# Patient Record
Sex: Female | Born: 1960 | Race: White | Hispanic: No | Marital: Single | Smoking: Never smoker
Health system: Southern US, Community
[De-identification: ages and names within clinical notes are randomized; demographics above are authoritative.]

## PROBLEM LIST (undated history)

## (undated) DIAGNOSIS — K219 Gastro-esophageal reflux disease without esophagitis: Secondary | ICD-10-CM

## (undated) DIAGNOSIS — I10 Essential (primary) hypertension: Secondary | ICD-10-CM

## (undated) DIAGNOSIS — K2970 Gastritis, unspecified, without bleeding: Secondary | ICD-10-CM

## (undated) DIAGNOSIS — D649 Anemia, unspecified: Secondary | ICD-10-CM

## (undated) HISTORY — PX: UPPER GASTROINTESTINAL ENDOSCOPY: SHX188

## (undated) HISTORY — PX: TONSILLECTOMY: SUR1361

## (undated) HISTORY — PX: COLONOSCOPY: SHX174

---

## 2005-01-29 ENCOUNTER — Ambulatory Visit: Payer: Self-pay | Admitting: Internal Medicine

## 2006-03-01 ENCOUNTER — Ambulatory Visit: Payer: Self-pay | Admitting: Internal Medicine

## 2007-03-28 ENCOUNTER — Ambulatory Visit: Payer: Self-pay | Admitting: Internal Medicine

## 2008-06-24 ENCOUNTER — Ambulatory Visit: Payer: Self-pay | Admitting: Obstetrics and Gynecology

## 2009-06-23 ENCOUNTER — Ambulatory Visit: Payer: Self-pay | Admitting: Gastroenterology

## 2009-06-25 ENCOUNTER — Ambulatory Visit: Payer: Self-pay | Admitting: Obstetrics and Gynecology

## 2010-07-07 ENCOUNTER — Ambulatory Visit: Payer: Self-pay | Admitting: Obstetrics and Gynecology

## 2011-08-03 ENCOUNTER — Ambulatory Visit: Payer: Self-pay | Admitting: Obstetrics and Gynecology

## 2012-10-05 ENCOUNTER — Ambulatory Visit: Payer: Self-pay | Admitting: Obstetrics and Gynecology

## 2013-06-14 ENCOUNTER — Ambulatory Visit: Payer: Self-pay | Admitting: Internal Medicine

## 2013-11-29 ENCOUNTER — Ambulatory Visit: Payer: Self-pay | Admitting: Obstetrics and Gynecology

## 2014-12-02 ENCOUNTER — Ambulatory Visit: Payer: Self-pay | Admitting: Internal Medicine

## 2015-06-25 ENCOUNTER — Other Ambulatory Visit: Payer: Self-pay | Admitting: Internal Medicine

## 2015-06-25 DIAGNOSIS — Z1231 Encounter for screening mammogram for malignant neoplasm of breast: Secondary | ICD-10-CM

## 2015-12-03 ENCOUNTER — Ambulatory Visit
Admission: RE | Admit: 2015-12-03 | Discharge: 2015-12-03 | Disposition: A | Discharge status: Home / Self Care | Payer: BLUE CROSS/BLUE SHIELD | Source: Ambulatory Visit | Attending: Internal Medicine | Admitting: Internal Medicine

## 2015-12-03 DIAGNOSIS — Z1231 Encounter for screening mammogram for malignant neoplasm of breast: Secondary | ICD-10-CM | POA: Insufficient documentation

## 2016-09-27 ENCOUNTER — Other Ambulatory Visit: Payer: Self-pay | Admitting: Obstetrics & Gynecology

## 2016-09-27 DIAGNOSIS — Z1231 Encounter for screening mammogram for malignant neoplasm of breast: Secondary | ICD-10-CM

## 2016-11-23 ENCOUNTER — Encounter
Admission: RE | Admit: 2016-11-23 | Discharge: 2016-11-23 | Disposition: A | Discharge status: Home / Self Care | Payer: BLUE CROSS/BLUE SHIELD | Source: Ambulatory Visit | Attending: Obstetrics & Gynecology | Admitting: Obstetrics & Gynecology

## 2016-11-23 DIAGNOSIS — N840 Polyp of corpus uteri: Secondary | ICD-10-CM | POA: Insufficient documentation

## 2016-11-23 DIAGNOSIS — Z01812 Encounter for preprocedural laboratory examination: Secondary | ICD-10-CM | POA: Insufficient documentation

## 2016-11-23 DIAGNOSIS — R938 Abnormal findings on diagnostic imaging of other specified body structures: Secondary | ICD-10-CM | POA: Insufficient documentation

## 2016-11-23 DIAGNOSIS — Z0183 Encounter for blood typing: Secondary | ICD-10-CM | POA: Diagnosis not present

## 2016-11-23 DIAGNOSIS — Z01818 Encounter for other preprocedural examination: Secondary | ICD-10-CM | POA: Insufficient documentation

## 2016-11-23 DIAGNOSIS — R9431 Abnormal electrocardiogram [ECG] [EKG]: Secondary | ICD-10-CM | POA: Diagnosis not present

## 2016-11-23 HISTORY — DX: Essential (primary) hypertension: I10

## 2016-11-23 HISTORY — DX: Gastritis, unspecified, without bleeding: K29.70

## 2016-11-23 HISTORY — DX: Gastro-esophageal reflux disease without esophagitis: K21.9

## 2016-11-23 HISTORY — DX: Anemia, unspecified: D64.9

## 2016-11-23 LAB — CBC
HCT: 39.1 % (ref 35.0–47.0)
Hemoglobin: 13.3 g/dL (ref 12.0–16.0)
MCH: 28.8 pg (ref 26.0–34.0)
MCHC: 34 g/dL (ref 32.0–36.0)
MCV: 84.5 fL (ref 80.0–100.0)
PLATELETS: 456 10*3/uL — AB (ref 150–440)
RBC: 4.62 MIL/uL (ref 3.80–5.20)
RDW: 14.8 % — ABNORMAL HIGH (ref 11.5–14.5)
WBC: 8 10*3/uL (ref 3.6–11.0)

## 2016-11-23 LAB — BASIC METABOLIC PANEL
Anion gap: 8 (ref 5–15)
BUN: 15 mg/dL (ref 6–20)
CO2: 28 mmol/L (ref 22–32)
Calcium: 10 mg/dL (ref 8.9–10.3)
Chloride: 102 mmol/L (ref 101–111)
Creatinine, Ser: 0.75 mg/dL (ref 0.44–1.00)
GFR calc Af Amer: >60 mL/min (ref >60)
Glucose, Bld: 103 mg/dL — ABNORMAL HIGH (ref 65–99)
POTASSIUM: 3.8 mmol/L (ref 3.5–5.1)
SODIUM: 138 mmol/L (ref 135–145)

## 2016-11-23 LAB — TYPE AND SCREEN
ABO/RH(D): A POS
Antibody Screen: NEGATIVE

## 2016-11-23 NOTE — Patient Instructions (Signed)
Your procedure is scheduled on: Friday 12/03/16 Report to DAY SURGERY. 2ND FLOOR MEDICAL MALL ENTRANCE. To find out your arrival time please call [phone] between 1PM - 3PM on Thursday 12/02/16.  Remember: Instructions that are not followed completely may result in serious medical risk, up to and including death, or upon the discretion of your surgeon and anesthesiologist your surgery may need to be rescheduled.    __X__ 1. Do not eat food or drink liquids after midnight. No gum chewing or hard candies.     __X__ 2. No Alcohol for 24 hours before or after surgery.   ____ 3. Bring all medications with you on the day of surgery if instructed.    __X__ 4. Notify your doctor if there is any change in your medical condition     (cold, fever, infections).             ___X__5. No smoking within 24 hours of your surgery.     Do not wear jewelry, make-up, hairpins, clips or nail polish.  Do not wear lotions, powders, or perfumes.   Do not shave 48 hours prior to surgery. Men may shave face and neck.  Do not bring valuables to the hospital.    [facility] is [facility] any belongings or valuables.               Contacts, dentures or bridgework may not be worn into surgery.  Leave your suitcase in the car. After surgery it may be brought to your room.  For patients admitted to the hospital, discharge time is determined by your                treatment team.   Patients discharged the day of surgery will not be allowed to drive home.   Please read over the following fact sheets that you were given:   Pain Booklet and MRSA Information   __X__ Take these medicines the morning of surgery with A SIP OF WATER:    1. PRILOSEC  2.   3.   4.  5.  6.  ____ Fleet Enema (as directed)   ____ Use CHG Soap as directed  ____ Use inhalers on the day of surgery  ____ Stop metformin 2 days prior to surgery    ____ Take 1/2 of usual insulin dose the night before surgery and none on the  morning of surgery.   __X__ Stop Coumadin/Plavix/aspirin on TODAY  __X__ Stop Anti-inflammatories such as Advil, Aleve, Ibuprofen, Motrin, Naproxen, Naprosyn, Goodies,powder, or aspirin products.  OK to take Tylenol.   __X__ Stop supplements until after surgery.    ____ Bring C-Pap to the hospital.

## 2016-11-23 NOTE — Pre-Procedure Instructions (Signed)
MEDICAL CLEARANCE REQUEST/EKG AS INSTRUCTED BY DR Randa NgoPISCITELLO CALLED AND FAXED TO LAURA AT DR Jolyn NapWARD'S AND ALSO CALLED AND FAXED TO DR B KLEIN'S OFFICE. SPOKE WITH DAWN

## 2016-12-02 NOTE — Pre-Procedure Instructions (Addendum)
HAVE NOT RECEIVED CLEARANCE. ASKING PCP OFFICE TO FAX TO [facility] [phone]. SPOKE WITH DAWN

## 2016-12-02 NOTE — Pre-Procedure Instructions (Signed)
Received clearance note, placed on chart.

## 2016-12-03 ENCOUNTER — Ambulatory Visit: Payer: BLUE CROSS/BLUE SHIELD

## 2016-12-03 ENCOUNTER — Encounter: Payer: Self-pay | Admitting: *Deleted

## 2016-12-03 ENCOUNTER — Ambulatory Visit: Payer: BLUE CROSS/BLUE SHIELD | Admitting: Certified Registered"

## 2016-12-03 ENCOUNTER — Encounter
Admission: RE | Disposition: A | Discharge status: Home / Self Care | Payer: Self-pay | Source: Ambulatory Visit | Attending: Obstetrics & Gynecology

## 2016-12-03 ENCOUNTER — Ambulatory Visit
Admission: RE | Admit: 2016-12-03 | Discharge: 2016-12-03 | Disposition: A | Discharge status: Home / Self Care | Payer: BLUE CROSS/BLUE SHIELD | Source: Ambulatory Visit | Attending: Obstetrics & Gynecology | Admitting: Obstetrics & Gynecology

## 2016-12-03 DIAGNOSIS — K2970 Gastritis, unspecified, without bleeding: Secondary | ICD-10-CM | POA: Diagnosis not present

## 2016-12-03 DIAGNOSIS — Z79899 Other long term (current) drug therapy: Secondary | ICD-10-CM | POA: Insufficient documentation

## 2016-12-03 DIAGNOSIS — D649 Anemia, unspecified: Secondary | ICD-10-CM | POA: Diagnosis not present

## 2016-12-03 DIAGNOSIS — I10 Essential (primary) hypertension: Secondary | ICD-10-CM | POA: Insufficient documentation

## 2016-12-03 DIAGNOSIS — K219 Gastro-esophageal reflux disease without esophagitis: Secondary | ICD-10-CM | POA: Insufficient documentation

## 2016-12-03 DIAGNOSIS — N840 Polyp of corpus uteri: Secondary | ICD-10-CM | POA: Diagnosis present

## 2016-12-03 HISTORY — PX: HYSTEROSCOPY WITH D & C: SHX1775

## 2016-12-03 LAB — ABO/RH: ABO/RH(D): A POS

## 2016-12-03 SURGERY — DILATATION AND CURETTAGE /HYSTEROSCOPY
Anesthesia: General | Wound class: Clean Contaminated

## 2016-12-03 MED ORDER — FENTANYL CITRATE (PF) 100 MCG/2ML IJ SOLN: 25.0000 ug | INTRAMUSCULAR | Status: DC | PRN

## 2016-12-03 MED ORDER — ONDANSETRON HCL 4 MG/2ML IJ SOLN: 4.0000 mg | Freq: Once | INTRAMUSCULAR | Status: DC | PRN

## 2016-12-03 MED ORDER — MIDAZOLAM HCL 2 MG/2ML IJ SOLN
INTRAMUSCULAR | Status: DC | PRN
  Administered 2016-12-03: 2 mg via INTRAVENOUS

## 2016-12-03 MED ORDER — FENTANYL CITRATE (PF) 100 MCG/2ML IJ SOLN
INTRAMUSCULAR | Status: AC
  Filled 2016-12-03: qty 2

## 2016-12-03 MED ORDER — MIDAZOLAM HCL 2 MG/2ML IJ SOLN
INTRAMUSCULAR | Status: AC
  Filled 2016-12-03: qty 2

## 2016-12-03 MED ORDER — DEXAMETHASONE SODIUM PHOSPHATE 10 MG/ML IJ SOLN
INTRAMUSCULAR | Status: DC | PRN
  Administered 2016-12-03: 5 mg via INTRAVENOUS

## 2016-12-03 MED ORDER — ONDANSETRON HCL 4 MG/2ML IJ SOLN
INTRAMUSCULAR | Status: DC | PRN
Start: 2016-12-03 — End: 2016-12-03
  Administered 2016-12-03: 4 mg via INTRAVENOUS

## 2016-12-03 MED ORDER — FENTANYL CITRATE (PF) 100 MCG/2ML IJ SOLN
INTRAMUSCULAR | Status: DC | PRN
  Administered 2016-12-03: 50 ug via INTRAVENOUS
  Administered 2016-12-03: 100 ug via INTRAVENOUS

## 2016-12-03 MED ORDER — PROPOFOL 10 MG/ML IV BOLUS
INTRAVENOUS | Status: AC
  Filled 2016-12-03: qty 20

## 2016-12-03 MED ORDER — LIDOCAINE HCL (PF) 1 % IJ SOLN
INTRAMUSCULAR | Status: AC
  Filled 2016-12-03: qty 30

## 2016-12-03 MED ORDER — LACTATED RINGERS IV SOLN
INTRAVENOUS | Status: DC
  Administered 2016-12-03: 12:00:00 via INTRAVENOUS

## 2016-12-03 MED ORDER — KETOROLAC TROMETHAMINE 30 MG/ML IJ SOLN
INTRAMUSCULAR | Status: DC | PRN
  Administered 2016-12-03: 30 mg via INTRAVENOUS

## 2016-12-03 MED ORDER — LIDOCAINE HCL (CARDIAC) 20 MG/ML IV SOLN
INTRAVENOUS | Status: DC | PRN
  Administered 2016-12-03: 50 mg via INTRAVENOUS

## 2016-12-03 SURGICAL SUPPLY — 18 items
BAG INFUSER PRESSURE 100CC (MISCELLANEOUS) ×2 IMPLANT
ELECT REM PT RETURN 9FT ADLT (ELECTROSURGICAL) ×2
ELECTRODE REM PT RTRN 9FT ADLT (ELECTROSURGICAL) ×1 IMPLANT
GLOVE PI ORTHOPRO 6.5 (GLOVE) ×1
GLOVE PI ORTHOPRO STRL 6.5 (GLOVE) ×1 IMPLANT
GLOVE SURG SYN 6.5 ES PF (GLOVE) ×2 IMPLANT
GLOVE SURG SYN 6.5 PF PI (GLOVE) ×1 IMPLANT
GOWN STRL REUS W/ TWL LRG LVL3 (GOWN DISPOSABLE) ×2 IMPLANT
GOWN STRL REUS W/TWL LRG LVL3 (GOWN DISPOSABLE) ×4
IV LACTATED RINGERS 1000ML (IV SOLUTION) ×2 IMPLANT
KIT RM TURNOVER CYSTO AR (KITS) ×2 IMPLANT
NDL SPNL 22GX3.5 QUINCKE BK (NEEDLE) ×1 IMPLANT
NEEDLE SPNL 22GX3.5 QUINCKE BK (NEEDLE) ×2 IMPLANT
PACK DNC HYST (MISCELLANEOUS) ×2 IMPLANT
PAD OB MATERNITY 4.3X12.25 (PERSONAL CARE ITEMS) ×2 IMPLANT
PAD PREP 24X41 OB/GYN DISP (PERSONAL CARE ITEMS) ×2 IMPLANT
SYRINGE 10CC LL (SYRINGE) ×2 IMPLANT
TUBING CONNECTING 10 (TUBING) ×2 IMPLANT

## 2016-12-03 NOTE — Anesthesia Procedure Notes (Signed)
Procedure Name: LMA Insertion Date/Time: 12/03/2016 12:19 PM Performed by: Deri FuellingPRIVETTE, Miriya Cloer Pre-anesthesia Checklist: Patient identified, Patient being monitored, Suction available, Timeout performed and Emergency Drugs available Patient Re-evaluated:Patient Re-evaluated prior to inductionOxygen Delivery Method: Circle system utilized Preoxygenation: Pre-oxygenation with 100% oxygen Intubation Type: IV induction Ventilation: Mask ventilation without difficulty LMA: LMA inserted LMA Size: 4.5

## 2016-12-03 NOTE — H&P (Signed)
Preoperative History and Physical   SERVICE: Gynecology   Patient Name: Tamara Jackson Patient MRN:   [id number]  CC: endometrial polyp  HPI: Tamara Jackson is a 56 y.o. who had an in-office pap with endometrial cells, and subsequent EMB showed fragments of an endometrial polyp.  She denies uterine bleeding/postmenopausal bleeding   Review of Systems: positives in bold GEN:   fevers, chills, weight changes, appetite changes, fatigue, night sweats HEENT:  HA, vision changes, hearing loss, congestion, rhinorrhea, sinus pressure, dysphagia CV:   CP, palpitations PULM:  SOB, cough GI:  abd pain, N/V/D/C GU:  dysuria, urgency, frequency MSK:  arthralgias, myalgias, back pain, swelling SKIN:  rashes, color changes, pallor NEURO:  numbness, weakness, tingling, seizures, dizziness, tremors PSYCH:  depression, anxiety, behavioral problems, confusion  HEME/LYMPH:  easy bruising or bleeding ENDO:  heat/cold intolerance  Past Obstetrical History: OB History    No data available      Past Gynecologic History: No LMP recorded. Patient is postmenopausal.   Past Medical History: Past Medical History:  Diagnosis Date  . Anemia   . Gastritis   . GERD (gastroesophageal reflux disease)   . Hypertension     Past Surgical History:   Past Surgical History:  Procedure Laterality Date  . COLONOSCOPY    . TONSILLECTOMY    . UPPER GASTROINTESTINAL ENDOSCOPY      Family History:  family history is not on file.  Social History:  Social History   Social History  . Marital status: Single    Spouse name: N/A  . Number of children: N/A  . Years of education: N/A   Occupational History  . Not on file.   Social History Main Topics  . Smoking status: Never Smoker  . Smokeless tobacco: Never Used  . Alcohol use Not on file     Comment: rare  . Drug use: No  . Sexual activity: Not on file   Other Topics Concern  . Not on file   Social History Narrative  . No narrative on  file    Home Medications:  Medications reconciled in EPIC  No current facility-administered medications on file prior to encounter.    No current outpatient prescriptions on file prior to encounter.    Allergies:  No Known Allergies  Physical Exam:  Temp:  [98.6 F (37 C)] 98.6 F (37 C) (03/23 0950) Pulse Rate:  [81] 81 (03/23 0950) Resp:  [16] 16 (03/23 0950) BP: (147)/(76) 147/76 (03/23 0950) SpO2:  [100 %] 100 % (03/23 0950) Weight:  [96.6 kg (213 lb)] 96.6 kg (213 lb) (03/23 0950)   General Appearance:  Well developed, well nourished, no acute distress, alert and oriented, cooperative and appears stated age HEENT:  Normocephalic atraumatic, extraocular movements intact, moist mucous membranes, neck supple with midline trachea and thyroid without masses Cardiovascular:  Normal S1/S2, regular rate and rhythm, no murmurs, 2+ distal pulses Pulmonary:  clear to auscultation, no wheezes, rales or rhonchi, symmetric air entry, good air exchange Abdomen:  Bowel sounds present, soft, nontender, nondistended, no abnormal masses or organomegaly, no epigastric pain Back: inspection of back is normal Extremities:  extremities normal, no tenderness, atraumatic, no cyanosis or edema Skin:  normal coloration and turgor, no rashes, no suspicious skin lesions noted  Neurologic:  Cranial nerves 2-12 grossly intact, grossly equal strength and muscle tone, normal speech, no focal findings or movement disorder noted. Psychiatric:  Normal mood and affect, appropriate, no AH/VH Pelvic:  NEFG, no vulvar masses or  lesions, normal vaginal mucosa, no vaginal bleeding or discharge, cervix without lesions or erythema, uterus, no adnexal masses appreciated, no palpable nodularity on rectovaginal exam, no pelvic organ prolapse,    Labs/Studies:   CBC and Coags:  Lab Results  Component Value Date   WBC 8.0 11/23/2016   HGB 13.3 11/23/2016   HCT 39.1 11/23/2016   MCV 84.5 11/23/2016   PLT 456 (H)  11/23/2016   CMP:  Lab Results  Component Value Date   NA 138 11/23/2016   K 3.8 11/23/2016   CL 102 11/23/2016   CO2 28 11/23/2016   BUN 15 11/23/2016   CREATININE 0.75 11/23/2016    Ultrasound    Uterus: anteverted 5x4x3cm with an EE of 9mm (max) LO: 2x1x1cm with simple cyst 1.2cm RO: 2.5x1.5x1.5cm No FF        Assessment / Plan:   Tamara GreaserVALERIE E Jackson is a 56 y.o.  With thickened endometrium, known polyp on EMB  1. The patient will have a D&C, hysteroscopy, polypectomy today as agreed in previous office encounter.  Risks and benefits were explained in detail, and she signed consent.     ----- Ranae Plumberhelsea Iran Rowe, MD Attending Obstetrician and Gynecologist [facility], Department of OB/GYN [facility]

## 2016-12-03 NOTE — Transfer of Care (Signed)
Immediate Anesthesia Transfer of Care Note  Patient: Tamara GreaserValerie E Laskaris  Procedure(s) Performed: Procedure(s): DILATATION AND CURETTAGE /HYSTEROSCOPY (N/A)  Patient Location: PACU  Anesthesia Type:General  Level of Consciousness: awake  Airway & Oxygen Therapy: Patient Spontanous Breathing  Post-op Assessment: Report given to RN  Post vital signs: Reviewed  Last Vitals:  Vitals:   12/03/16 0950 12/03/16 1259  BP: (!) 147/76 (!) 159/75  Pulse: 81 82  Resp: 16 15  Temp: 37 C 36.1 C    Last Pain:  Vitals:   12/03/16 0950  TempSrc: Oral         Complications: No apparent anesthesia complications

## 2016-12-03 NOTE — Anesthesia Postprocedure Evaluation (Signed)
Anesthesia Post Note  Patient: Tamara GreaserValerie E Kunzman  Procedure(s) Performed: Procedure(s) (LRB): DILATATION AND CURETTAGE /HYSTEROSCOPY (N/A)  Patient location during evaluation: Endoscopy Anesthesia Type: General Level of consciousness: awake and alert Pain management: pain level controlled Vital Signs Assessment: post-procedure vital signs reviewed and stable Respiratory status: spontaneous breathing and respiratory function stable Cardiovascular status: stable Anesthetic complications: no     Last Vitals:  Vitals:   12/03/16 1259 12/03/16 1314  BP: (!) 159/75 (!) 144/41  Pulse: 82 76  Resp: 15 17  Temp: 36.1 C     Last Pain:  Vitals:   12/03/16 1314  TempSrc:   PainSc: 2                  Alec Jaros K

## 2016-12-03 NOTE — Anesthesia Post-op Follow-up Note (Cosign Needed)
Anesthesia QCDR form completed.        

## 2016-12-03 NOTE — Discharge Instructions (Signed)
You should expect to have some cramping and vaginal bleeding for about a week. This should taper off and subside, much like a period. If heavy bleeding continues or gets worse, you should contact the office for an earlier appointment.  ° °Please call the office or physician on call for fever >101, severe pain, and heavy bleeding.   [phone] ° °NOTHING IN THE VAGINA FOR 2 WEEKS!! ° ° °AMBULATORY SURGERY  °DISCHARGE INSTRUCTIONS ° ° °1) The drugs that you were given will stay in your system until tomorrow so for the next 24 hours you should not: ° °A) Drive an automobile °B) Make any legal decisions °C) Drink any alcoholic beverage ° ° °2) You may resume regular meals tomorrow.  Today it is better to start with liquids and gradually work up to solid foods. ° °You may eat anything you prefer, but it is better to start with liquids, then soup and crackers, and gradually work up to solid foods. ° ° °3) Please notify your doctor immediately if you have any unusual bleeding, trouble breathing, redness and pain at the surgery site, drainage, fever, or pain not relieved by medication. ° ° ° °4) Additional Instructions: ° ° ° ° ° ° ° °Please contact your physician with any problems or Same Day Surgery at [phone], Monday through Friday 6 am to 4 pm, or [facility] [place] Main number at [phone]. ° ° ° °

## 2016-12-03 NOTE — Anesthesia Preprocedure Evaluation (Signed)
Anesthesia Evaluation  Patient identified by MRN, date of birth, ID band Patient awake    Reviewed: Allergy & Precautions, NPO status , Patient's Chart, lab work & pertinent test results  History of Anesthesia Complications Negative for: history of anesthetic complications  Airway Mallampati: II       Dental   Pulmonary neg pulmonary ROS,           Cardiovascular hypertension, Pt. on medications      Neuro/Psych negative neurological ROS     GI/Hepatic Neg liver ROS, GERD  Medicated and Controlled,  Endo/Other  negative endocrine ROS  Renal/GU negative Renal ROS     Musculoskeletal   Abdominal   Peds  Hematology  (+) anemia ,   Anesthesia Other Findings   Reproductive/Obstetrics                            Anesthesia Physical Anesthesia Plan  ASA: II  Anesthesia Plan: General   Post-op Pain Management:    Induction: Intravenous  Airway Management Planned:   Additional Equipment:   Intra-op Plan:   Post-operative Plan:   Informed Consent: I have reviewed the patients History and Physical, chart, labs and discussed the procedure including the risks, benefits and alternatives for the proposed anesthesia with the patient or authorized representative who has indicated his/her understanding and acceptance.     Plan Discussed with:   Anesthesia Plan Comments:         Anesthesia Quick Evaluation

## 2016-12-03 NOTE — Op Note (Signed)
Operative Report Hysteroscopy, Dilation and Curettage 12/03/2016  Patient:  Tamara Jackson  56 y.o. female Preoperative diagnosis:  Thickened Endometrium  Endometrial Polyp Postoperative diagnosis:  Thickened Endometrium  Endometrial Polyp  PROCEDURE:  Procedure(s): DILATATION AND CURETTAGE /HYSTEROSCOPY (N/A) Surgeon:  Surgeon(s) and Role:    * Chelsea C Ward, MD - Primary Anesthesia:  LMA I/O: Total I/O In: 700 [I.V.:700] Out: 5 [Blood:5] Specimens:  Endometrial curettings Complications: None Apparent Disposition:  VS stable to PACU  Findings: Uterus, mobile, normal size, sounding to 7 cm; normal cervix, vagina, perineum. Atrophic and benign endometrium. Both ostea patent, tiny polyp at endocervical/uterine junction  Indication for procedure/Consents: 56 y.o. here for scheduled surgery for the aforementioned diagnoses.   Risks of surgery were discussed with the patient including but not limited to: bleeding which may require transfusion; infection which may require antibiotics; injury to uterus or surrounding organs; intrauterine scarring which may impair future fertility; need for additional procedures including laparotomy or laparoscopy; and other postoperative/anesthesia complications. Written informed consent was obtained.    Procedure Details:   The patient was then taken to the operating room where anesthesia was administered and was found to be adequate.  After a formal and adequate timeout was performed, she was placed in the dorsal lithotomy position and examined with the above findings. She was then prepped and draped in the sterile manner.  A speculum was then placed in the patient's vagina and a single tooth tenaculum was applied to the anterior lip of the cervix.    The uterus was sounded to 7cm. Her cervix was serially dilated to accommodate the myoscope, with findings as above. A sharp curettage was then performed until there was a gritty texture in all four quadrants.  The specimen was handed off to nursing.  The tenaculum was removed from the anterior lip of the cervix and the vaginal speculum was removed after noting good hemostasis. The patient tolerated the procedure well and was taken to the recovery area awake, in stable condition.  The patient will be discharged to home as per PACU criteria.  Routine postoperative instructions given. She will follow up in the clinic in two to four weeks for postoperative evaluation.  Ranae Plumberhelsea Ward, MD [facility] OBGYN Attending Gynecologist

## 2016-12-04 ENCOUNTER — Encounter: Payer: Self-pay | Admitting: Obstetrics & Gynecology

## 2016-12-06 ENCOUNTER — Ambulatory Visit
Admission: RE | Admit: 2016-12-06 | Discharge: 2016-12-06 | Disposition: A | Discharge status: Home / Self Care | Payer: BLUE CROSS/BLUE SHIELD | Source: Ambulatory Visit | Attending: Obstetrics & Gynecology | Admitting: Obstetrics & Gynecology

## 2016-12-06 DIAGNOSIS — Z1231 Encounter for screening mammogram for malignant neoplasm of breast: Secondary | ICD-10-CM | POA: Insufficient documentation

## 2016-12-06 LAB — SURGICAL PATHOLOGY

## 2017-10-27 ENCOUNTER — Other Ambulatory Visit: Payer: Self-pay | Admitting: Internal Medicine

## 2017-10-27 DIAGNOSIS — Z1231 Encounter for screening mammogram for malignant neoplasm of breast: Secondary | ICD-10-CM

## 2017-12-21 ENCOUNTER — Ambulatory Visit
Admission: RE | Admit: 2017-12-21 | Discharge: 2017-12-21 | Disposition: A | Discharge status: Home / Self Care | Payer: BLUE CROSS/BLUE SHIELD | Source: Ambulatory Visit | Attending: Internal Medicine | Admitting: Internal Medicine

## 2017-12-21 DIAGNOSIS — Z1231 Encounter for screening mammogram for malignant neoplasm of breast: Secondary | ICD-10-CM | POA: Insufficient documentation

## 2018-11-21 ENCOUNTER — Other Ambulatory Visit: Payer: Self-pay | Admitting: Internal Medicine

## 2018-11-21 DIAGNOSIS — Z1231 Encounter for screening mammogram for malignant neoplasm of breast: Secondary | ICD-10-CM

## 2019-06-25 ENCOUNTER — Ambulatory Visit
Admission: RE | Admit: 2019-06-25 | Discharge: 2019-06-25 | Disposition: A | Discharge status: Home / Self Care | Payer: BC Managed Care – PPO | Source: Ambulatory Visit | Attending: Internal Medicine | Admitting: Internal Medicine

## 2019-06-25 DIAGNOSIS — Z1231 Encounter for screening mammogram for malignant neoplasm of breast: Secondary | ICD-10-CM | POA: Diagnosis not present

## 2020-04-24 LAB — EXTERNAL GENERIC LAB PROCEDURE

## 2020-05-21 ENCOUNTER — Other Ambulatory Visit: Payer: Self-pay | Admitting: Internal Medicine

## 2020-05-21 DIAGNOSIS — Z1231 Encounter for screening mammogram for malignant neoplasm of breast: Secondary | ICD-10-CM

## 2020-06-03 LAB — EXTERNAL GENERIC LAB PROCEDURE: COLOGUARD: NEGATIVE

## 2020-07-30 ENCOUNTER — Other Ambulatory Visit: Payer: Self-pay

## 2020-07-30 ENCOUNTER — Ambulatory Visit
Admission: RE | Admit: 2020-07-30 | Discharge: 2020-07-30 | Disposition: A | Discharge status: Home / Self Care | Payer: BC Managed Care – PPO | Source: Ambulatory Visit | Attending: Internal Medicine | Admitting: Internal Medicine

## 2020-07-30 DIAGNOSIS — Z1231 Encounter for screening mammogram for malignant neoplasm of breast: Secondary | ICD-10-CM | POA: Diagnosis present

## 2021-06-30 ENCOUNTER — Other Ambulatory Visit: Payer: Self-pay | Admitting: Internal Medicine

## 2021-06-30 DIAGNOSIS — Z1231 Encounter for screening mammogram for malignant neoplasm of breast: Secondary | ICD-10-CM

## 2021-08-13 ENCOUNTER — Other Ambulatory Visit: Payer: Self-pay

## 2021-08-13 ENCOUNTER — Ambulatory Visit
Admission: RE | Admit: 2021-08-13 | Discharge: 2021-08-13 | Disposition: A | Discharge status: Home / Self Care | Payer: BC Managed Care – PPO | Source: Ambulatory Visit | Attending: Internal Medicine | Admitting: Internal Medicine

## 2021-08-13 DIAGNOSIS — Z1231 Encounter for screening mammogram for malignant neoplasm of breast: Secondary | ICD-10-CM | POA: Insufficient documentation

## 2021-12-08 IMAGING — MG DIGITAL SCREENING BILAT W/ TOMO W/ CAD
6 of 10 series · 6 of 30 positions shown · non-contrast
Comparison: Previous exam(s).

CLINICAL DATA: Screening.

EXAM:
DIGITAL SCREENING BILATERAL MAMMOGRAM WITH TOMO AND CAD

[R MLO synth-2D]
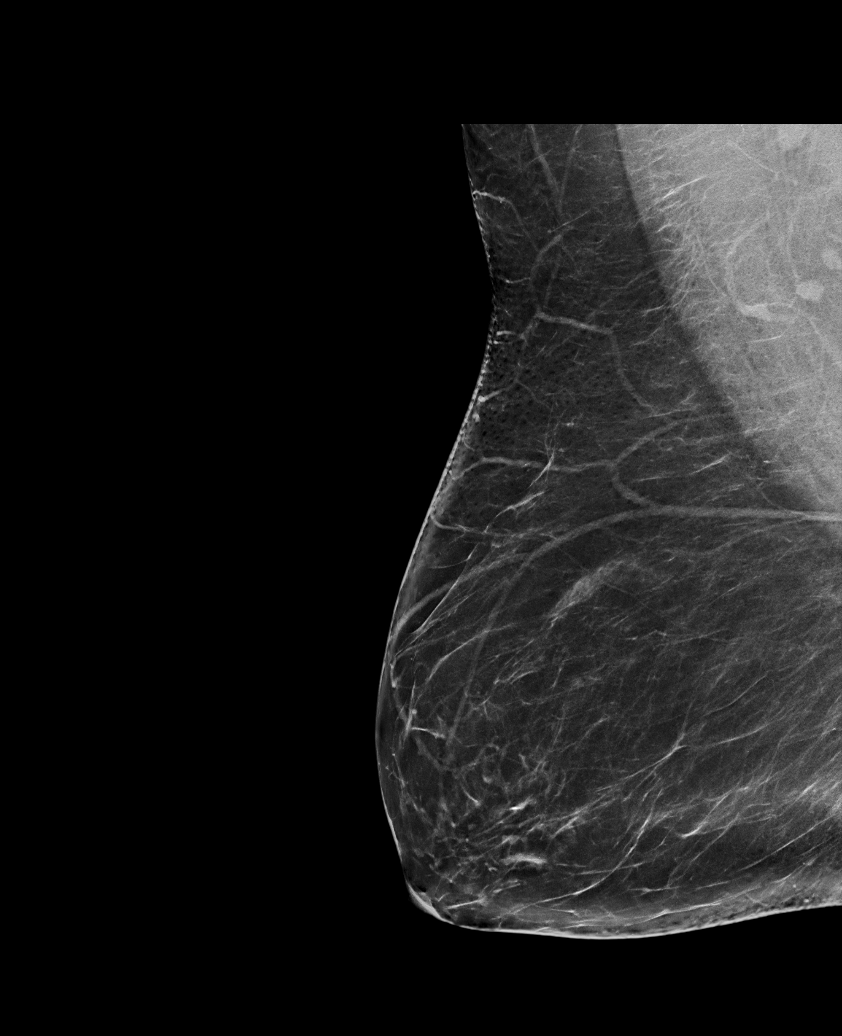

[L MLO synth-2D (1 of 2)]
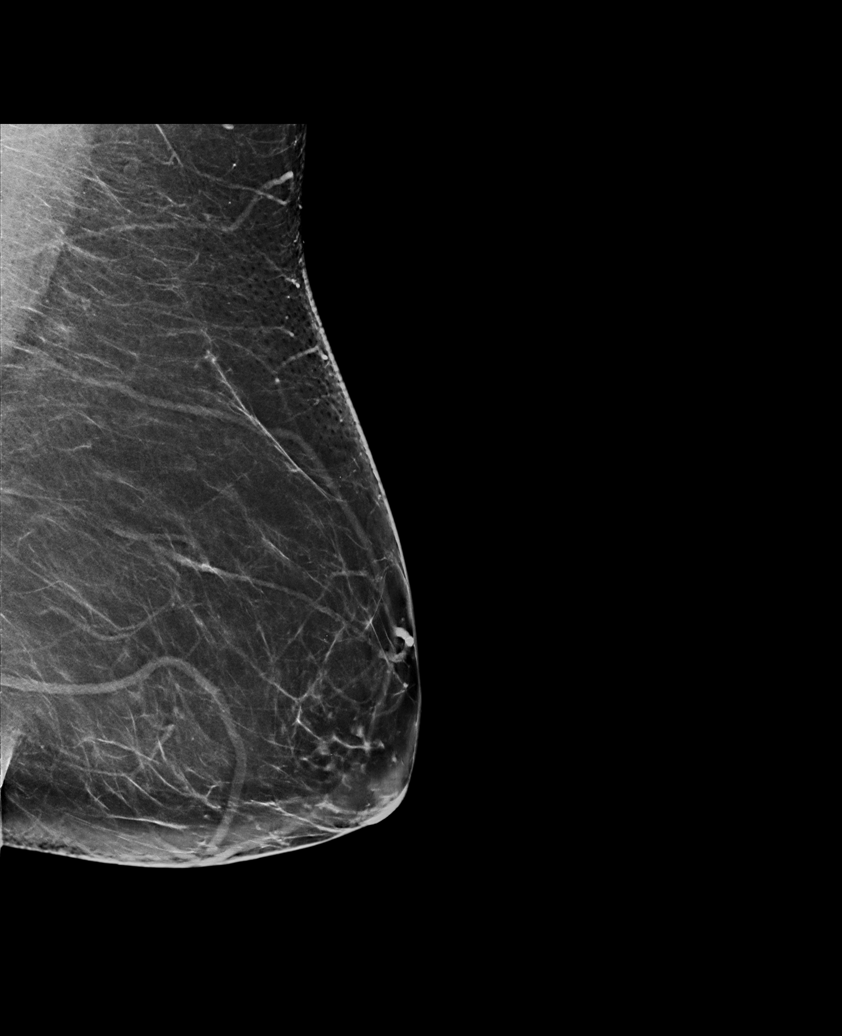

[R CC synth-2D]
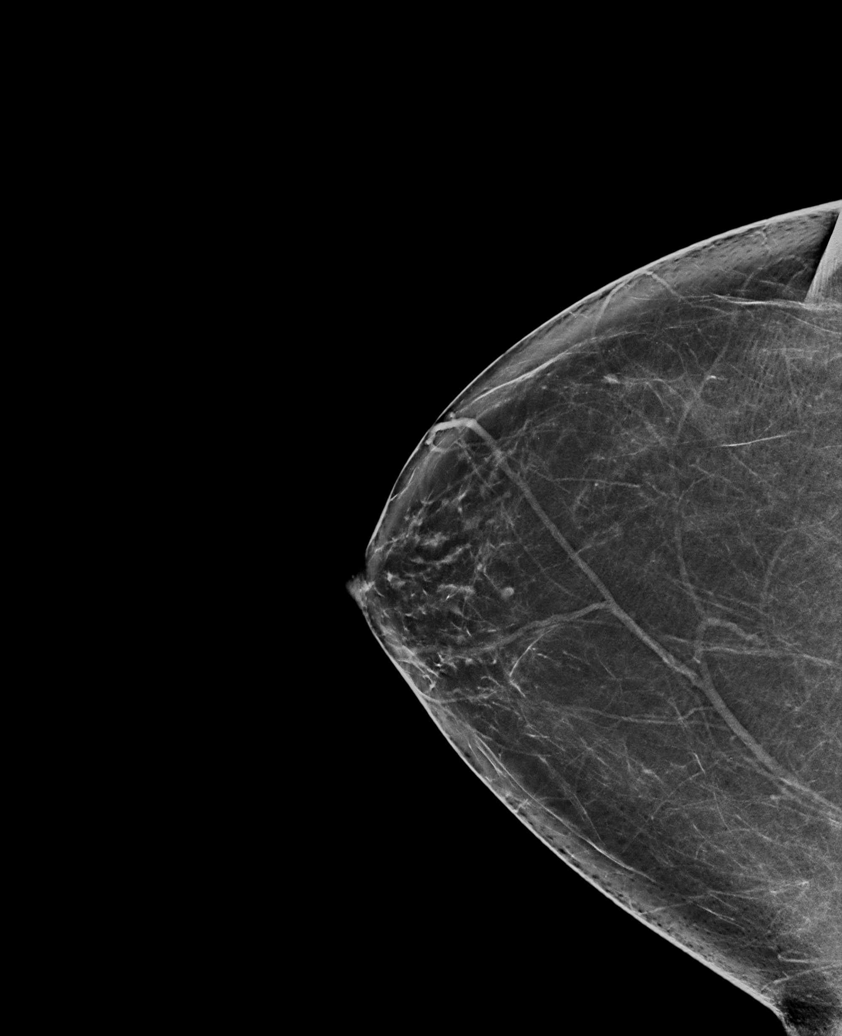

[L CC synth-2D]
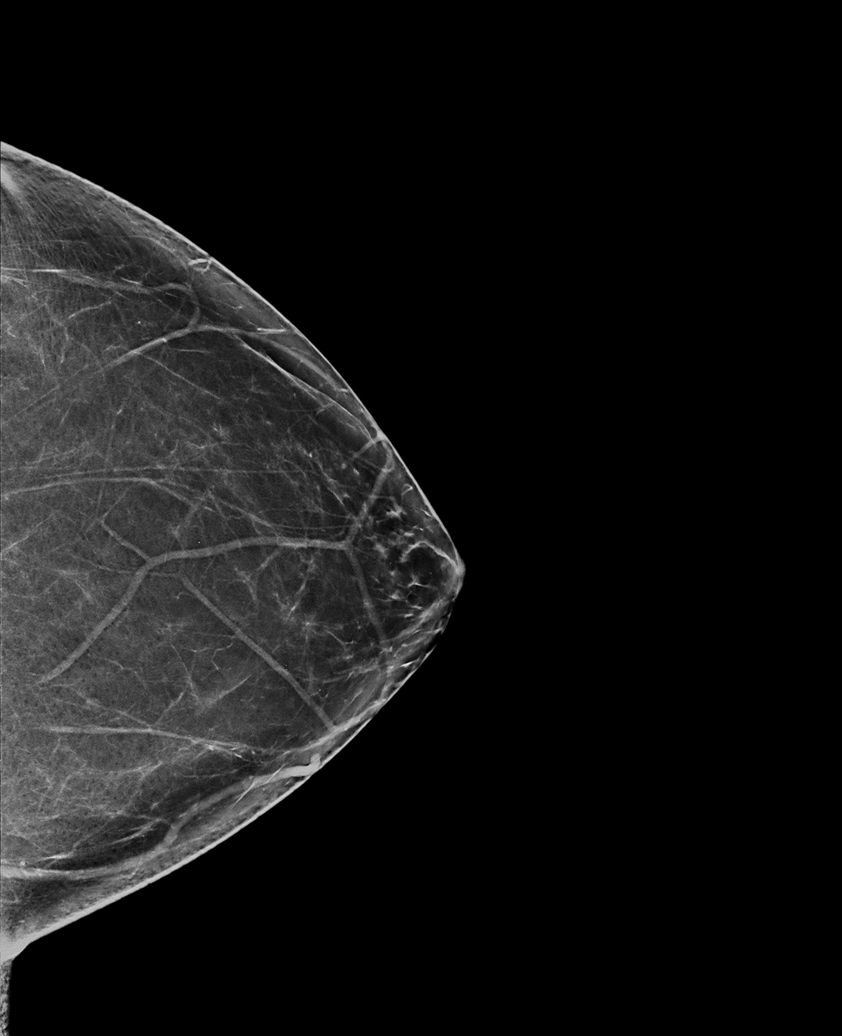

[L MLO synth-2D (2 of 2)]
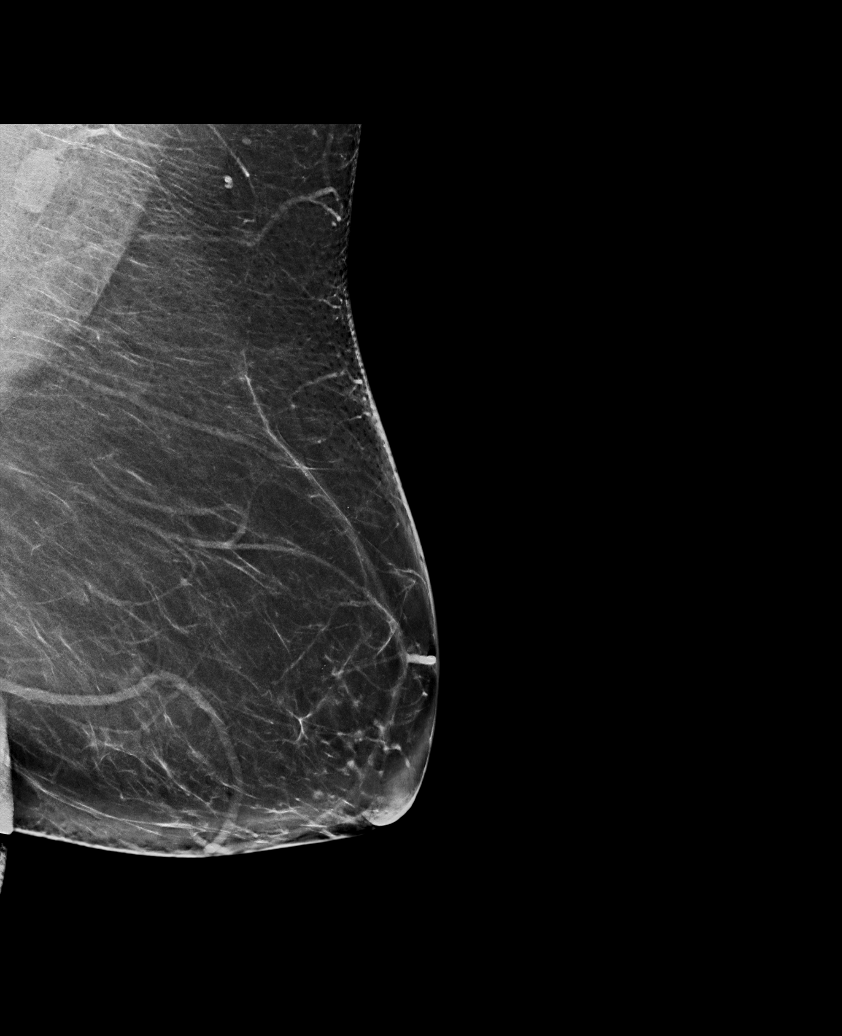

[R MLO tomo · tomo slice 43/84.0]
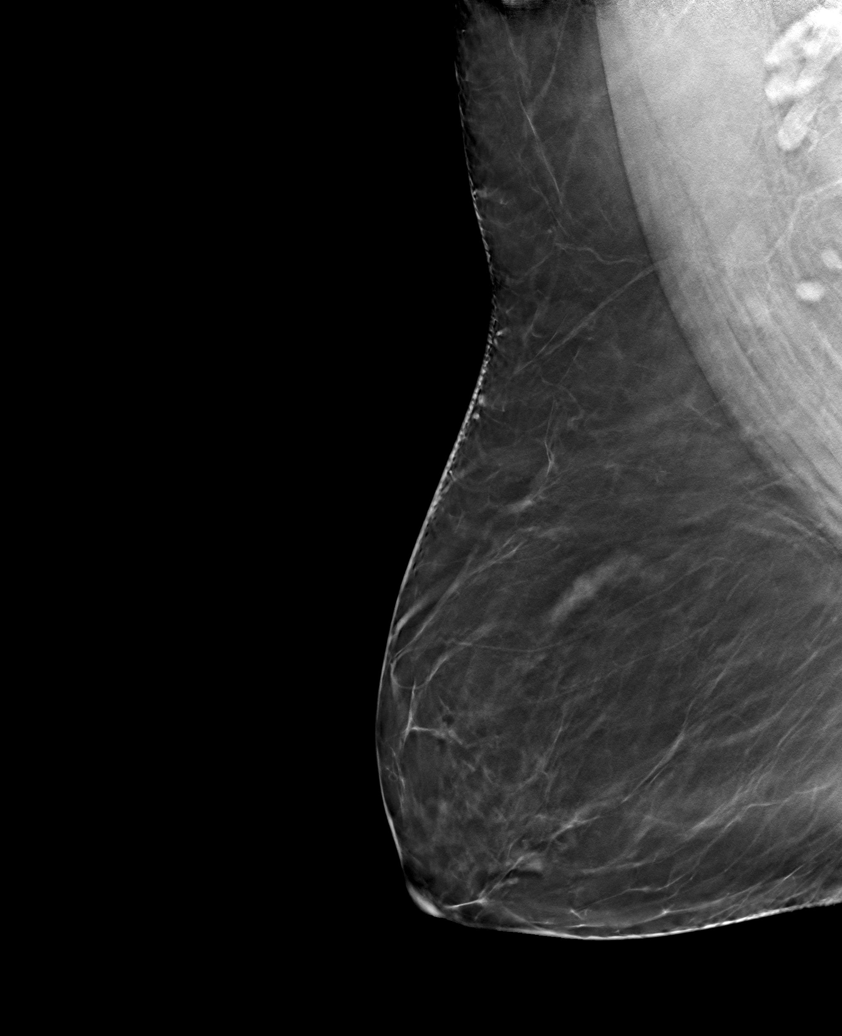

[6 of 30 positions shown; findings below may reference images not displayed]

ACR Breast Density Category b: There are scattered areas of
fibroglandular density.
FINDINGS: There are no findings suspicious for malignancy. Images were
processed with CAD.
IMPRESSION: No mammographic evidence of malignancy. A result letter of this
screening mammogram will be mailed directly to the patient.

RECOMMENDATION:
Screening mammogram in one year. (Code:[id number])

BI-RADS CATEGORY  1: Negative.

## 2022-07-14 ENCOUNTER — Other Ambulatory Visit: Payer: Self-pay | Admitting: Internal Medicine

## 2022-07-14 DIAGNOSIS — Z1231 Encounter for screening mammogram for malignant neoplasm of breast: Secondary | ICD-10-CM

## 2022-08-17 ENCOUNTER — Ambulatory Visit
Admission: RE | Admit: 2022-08-17 | Discharge: 2022-08-17 | Disposition: A | Discharge status: Home / Self Care | Payer: BC Managed Care – PPO | Source: Ambulatory Visit | Attending: Internal Medicine | Admitting: Internal Medicine

## 2022-08-17 DIAGNOSIS — Z1231 Encounter for screening mammogram for malignant neoplasm of breast: Secondary | ICD-10-CM | POA: Diagnosis present

## 2023-07-14 ENCOUNTER — Other Ambulatory Visit: Payer: Self-pay | Admitting: Internal Medicine

## 2023-07-14 DIAGNOSIS — Z1231 Encounter for screening mammogram for malignant neoplasm of breast: Secondary | ICD-10-CM

## 2023-08-28 LAB — EXTERNAL GENERIC LAB PROCEDURE: COLOGUARD: NEGATIVE

## 2023-09-13 ENCOUNTER — Ambulatory Visit
Admission: RE | Admit: 2023-09-13 | Discharge: 2023-09-13 | Disposition: A | Discharge status: Home / Self Care | Payer: BC Managed Care – PPO | Source: Ambulatory Visit | Attending: Internal Medicine | Admitting: Internal Medicine

## 2023-09-13 DIAGNOSIS — Z1231 Encounter for screening mammogram for malignant neoplasm of breast: Secondary | ICD-10-CM | POA: Diagnosis present
